# Patient Record
Sex: Female | Born: 1980 | Race: Black or African American | Hispanic: No | Marital: Single | Smoking: Never smoker
Health system: Southern US, Community
[De-identification: ages and names within clinical notes are randomized; demographics above are authoritative.]

## PROBLEM LIST (undated history)

## (undated) DIAGNOSIS — N200 Calculus of kidney: Secondary | ICD-10-CM

## (undated) HISTORY — DX: Calculus of kidney: N20.0

---

## 2015-04-06 ENCOUNTER — Emergency Department
Admission: EM | Admit: 2015-04-06 | Discharge: 2015-04-07 | Disposition: A | Discharge status: Home / Self Care | Payer: BC Managed Care – PPO | Attending: Emergency Medicine | Admitting: Emergency Medicine

## 2015-04-06 ENCOUNTER — Encounter: Payer: Self-pay | Admitting: *Deleted

## 2015-04-06 DIAGNOSIS — Z79899 Other long term (current) drug therapy: Secondary | ICD-10-CM | POA: Diagnosis not present

## 2015-04-06 DIAGNOSIS — R109 Unspecified abdominal pain: Secondary | ICD-10-CM | POA: Diagnosis present

## 2015-04-06 DIAGNOSIS — N200 Calculus of kidney: Secondary | ICD-10-CM

## 2015-04-06 DIAGNOSIS — Z3202 Encounter for pregnancy test, result negative: Secondary | ICD-10-CM | POA: Diagnosis not present

## 2015-04-06 NOTE — ED Notes (Signed)
Pt arrives ambulatory to triage with c/o left flank pain for two days, vomiting for several hours. Denies urinary symptoms

## 2015-04-07 ENCOUNTER — Emergency Department: Payer: BC Managed Care – PPO

## 2015-04-07 LAB — COMPREHENSIVE METABOLIC PANEL
ALK PHOS: 45 U/L (ref 38–126)
ALT: 12 U/L — AB (ref 14–54)
ANION GAP: 7 (ref 5–15)
AST: 18 U/L (ref 15–41)
Albumin: 4.1 g/dL (ref 3.5–5.0)
BILIRUBIN TOTAL: 0.4 mg/dL (ref 0.3–1.2)
BUN: 15 mg/dL (ref 6–20)
CALCIUM: 9.2 mg/dL (ref 8.9–10.3)
CO2: 24 mmol/L (ref 22–32)
CREATININE: 1.11 mg/dL — AB (ref 0.44–1.00)
Chloride: 105 mmol/L (ref 101–111)
GFR calc non Af Amer: >60 mL/min (ref >60)
GLUCOSE: 137 mg/dL — AB (ref 65–99)
Potassium: 3.9 mmol/L (ref 3.5–5.1)
Sodium: 136 mmol/L (ref 135–145)
TOTAL PROTEIN: 7.8 g/dL (ref 6.5–8.1)

## 2015-04-07 LAB — CBC
HCT: 33.4 % — ABNORMAL LOW (ref 35.0–47.0)
HEMOGLOBIN: 10.6 g/dL — AB (ref 12.0–16.0)
MCH: 28.4 pg (ref 26.0–34.0)
MCHC: 31.9 g/dL — AB (ref 32.0–36.0)
MCV: 89.1 fL (ref 80.0–100.0)
PLATELETS: 348 10*3/uL (ref 150–440)
RBC: 3.75 MIL/uL — AB (ref 3.80–5.20)
RDW: 16.6 % — ABNORMAL HIGH (ref 11.5–14.5)
WBC: 13.8 10*3/uL — ABNORMAL HIGH (ref 3.6–11.0)

## 2015-04-07 LAB — URINALYSIS COMPLETE WITH MICROSCOPIC (ARMC ONLY)
BILIRUBIN URINE: NEGATIVE
Glucose, UA: NEGATIVE mg/dL
Leukocytes, UA: NEGATIVE
NITRITE: NEGATIVE
PROTEIN: 30 mg/dL — AB
SPECIFIC GRAVITY, URINE: 1.029 (ref 1.005–1.030)
pH: 5 (ref 5.0–8.0)

## 2015-04-07 LAB — PREGNANCY, URINE: Preg Test, Ur: NEGATIVE

## 2015-04-07 LAB — LIPASE, BLOOD: Lipase: 26 U/L (ref 11–51)

## 2015-04-07 MED ORDER — MORPHINE SULFATE (PF) 4 MG/ML IV SOLN
4.0000 mg | Freq: Once | INTRAVENOUS | Status: AC
  Administered 2015-04-07: 4 mg via INTRAVENOUS
  Filled 2015-04-07: qty 1

## 2015-04-07 MED ORDER — SODIUM CHLORIDE 0.9 % IV BOLUS (SEPSIS)
1000.0000 mL | Freq: Once | INTRAVENOUS | Status: AC
  Administered 2015-04-07: 1000 mL via INTRAVENOUS

## 2015-04-07 MED ORDER — ONDANSETRON 4 MG PO TBDP: 4.0000 mg | ORAL_TABLET | Freq: Three times a day (TID) | ORAL | Status: AC | PRN

## 2015-04-07 MED ORDER — HYDROCODONE-ACETAMINOPHEN 5-325 MG PO TABS: 1.0000 | ORAL_TABLET | Freq: Four times a day (QID) | ORAL | Status: DC | PRN

## 2015-04-07 MED ORDER — KETOROLAC TROMETHAMINE 30 MG/ML IJ SOLN
30.0000 mg | Freq: Once | INTRAMUSCULAR | Status: AC
  Administered 2015-04-07: 30 mg via INTRAVENOUS
  Filled 2015-04-07: qty 1

## 2015-04-07 MED ORDER — ONDANSETRON HCL 4 MG/2ML IJ SOLN
4.0000 mg | Freq: Once | INTRAMUSCULAR | Status: AC
  Administered 2015-04-07: 4 mg via INTRAVENOUS
  Filled 2015-04-07: qty 2

## 2015-04-07 MED ORDER — TAMSULOSIN HCL 0.4 MG PO CAPS
0.4000 mg | ORAL_CAPSULE | Freq: Once | ORAL | Status: AC
  Administered 2015-04-07: 0.4 mg via ORAL
  Filled 2015-04-07: qty 1

## 2015-04-07 MED ORDER — TAMSULOSIN HCL 0.4 MG PO CAPS: 0.4000 mg | ORAL_CAPSULE | Freq: Every day | ORAL | Status: DC

## 2015-04-07 MED ORDER — HYDROCODONE-ACETAMINOPHEN 5-325 MG PO TABS
2.0000 | ORAL_TABLET | Freq: Once | ORAL | Status: AC
  Administered 2015-04-07: 2 via ORAL
  Filled 2015-04-07: qty 2

## 2015-04-07 NOTE — ED Notes (Signed)
Lab called to add on urine pregnancy test to urine already in lab.

## 2015-04-07 NOTE — ED Notes (Signed)
MD at bedside for reeval

## 2015-04-07 NOTE — ED Provider Notes (Signed)
[facility] Emergency Department Provider Note  ____________________________________________  Time seen: Approximately 0011 AM  I have reviewed the triage vital signs and the nursing notes.   HISTORY  Chief Complaint Flank Pain    HPI Claudia Lyons is a 34 y.o. female who comes into the hospital today with left flank pain vomiting and nausea. The patient reports that she has been vomiting for the last few hours. She reports that the symptoms started this evening at 8 PM. She started feeling sick on Saturday but the pain and the vomiting did not start until today. The patient reports that she did notice some blood in her urine but denies pain with urination. The patient does not have a history of kidney stones. She reports that the pain moves a little bit around to her abdomen. She denies any fevers or other symptoms. The patient did not take anything for pain but came in for evaluation. Nothing makes the symptoms better or worse. The patient reports that her pain is a 10 out of 10 in intensity.   History reviewed. No pertinent past medical history.  There are no active problems to display for this patient.   Past Surgical History  Procedure Laterality Date  . Cesarean section      Current Outpatient Rx  Name  Route  Sig  Dispense  Refill  . HYDROcodone-acetaminophen (NORCO) 5-325 MG tablet   Oral   Take 1-2 tablets by mouth every 6 (six) hours as needed for moderate pain.   12 tablet   0   . ondansetron (ZOFRAN ODT) 4 MG disintegrating tablet   Oral   Take 1 tablet (4 mg total) by mouth every 8 (eight) hours as needed for nausea or vomiting.   20 tablet   0   . tamsulosin (FLOMAX) 0.4 MG CAPS capsule   Oral   Take 1 capsule (0.4 mg total) by mouth daily.   7 capsule   0     Allergies Percocet  No family history on file.  Social History Social History  Substance Use Topics  . Smoking status: Never Smoker   . Smokeless tobacco: None   . Alcohol Use: No    Review of Systems Constitutional: No fever/chills Eyes: No visual changes. ENT: No sore throat. Cardiovascular: Denies chest pain. Respiratory: Denies shortness of breath. Gastrointestinal: Nausea and vomiting Genitourinary: Negative for dysuria. Musculoskeletal: Left flank pain Skin: Negative for rash. Neurological: Negative for headaches, focal weakness or numbness.  10-point ROS otherwise negative.  ____________________________________________   PHYSICAL EXAM:  VITAL SIGNS: ED Triage Vitals  Enc Vitals Group     BP 04/06/15 2352 132/85 mmHg     Pulse Rate 04/06/15 2352 66     Resp 04/06/15 2352 16     Temp 04/06/15 2352 98.4 F (36.9 C)     Temp Source 04/06/15 2352 Oral     SpO2 04/06/15 2352 100 %     Weight 04/06/15 2352 170 lb (77.111 kg)     Height 04/06/15 2352 5\' 6"  (1.676 m)     Head Cir --      Peak Flow --      Pain Score 04/06/15 2354 10     Pain Loc --      Pain Edu? --      Excl. in GC? --     Constitutional: Alert and oriented. ill appearing and in moderate to severe distress. Eyes: Conjunctivae are normal. PERRL. EOMI. Head: Atraumatic. Nose: No congestion/rhinnorhea. Mouth/Throat:  Mucous membranes are moist.  Oropharynx non-erythematous. Cardiovascular: Normal rate, regular rhythm. Grossly normal heart sounds.  Good peripheral circulation. Respiratory: Normal respiratory effort.  No retractions. Lungs CTAB. Gastrointestinal: Soft and nontender. No distention. Positive bowel sounds, left CVA tenderness to palpation Musculoskeletal: No lower extremity tenderness nor edema.   Neurologic:  Normal speech and language. No gross focal neurologic deficits are appreciated.  Skin:  Skin is warm, dry and intact.  Psychiatric: Mood and affect are normal. .  ____________________________________________   LABS (all labs ordered are listed, but only abnormal results are displayed)  Labs Reviewed  COMPREHENSIVE METABOLIC PANEL -  Abnormal; Notable for the following:    Glucose, Bld 137 (*)    Creatinine, Ser 1.11 (*)    ALT 12 (*)    All other components within normal limits  CBC - Abnormal; Notable for the following:    WBC 13.8 (*)    RBC 3.75 (*)    Hemoglobin 10.6 (*)    HCT 33.4 (*)    MCHC 31.9 (*)    RDW 16.6 (*)    All other components within normal limits  URINALYSIS COMPLETEWITH MICROSCOPIC ([facility] ONLY) - Abnormal; Notable for the following:    Color, Urine YELLOW (*)    APPearance CLOUDY (*)    Ketones, ur TRACE (*)    Hgb urine dipstick 3+ (*)    Protein, ur 30 (*)    Bacteria, UA RARE (*)    Squamous Epithelial / LPF 6-30 (*)    All other components within normal limits  LIPASE, BLOOD  PREGNANCY, URINE   ____________________________________________  EKG  None ____________________________________________  RADIOLOGY  CT renal stone study: Mild left-sided hydronephrosis with an obstructing 5 mm stone noted distally in the left vesicoureteral junction ____________________________________________   PROCEDURES  Procedure(s) performed: None  Critical Care performed: No  ____________________________________________   INITIAL IMPRESSION / ASSESSMENT AND PLAN / ED COURSE  Pertinent labs & imaging results that were available during my care of the patient were reviewed by me and considered in my medical decision making (see chart for details).  This is a 34 year old female who comes in today with some left flank pain and hematuria. I am concerned the patient may have a kidney stone and she does have some blood in her urine. I will send the patient for a CT scan to determine if she does have kidney stones causing her pain. I did give the patient a liter of normal saline, morphine 4 mg IV 1 and Zofran 4 mg IV 1. I will reassess the patient when she's receive her medication.  The patient received a second dose of morphine and was still having pain so I did give her a dose of Toradol which  did help resolve her pain. I also give the patient Flomax and oral Norco. The patient will be discharged home to follow-up with urology. ____________________________________________   FINAL CLINICAL IMPRESSION(S) / ED DIAGNOSES  Final diagnoses:  Left flank pain  Kidney stone      Rebecka Apley, MD 04/07/15 0400

## 2015-04-07 NOTE — ED Notes (Signed)
Patient transported to CT via stretcher.

## 2015-04-07 NOTE — Discharge Instructions (Signed)
Kidney Stones °Kidney stones (urolithiasis) are deposits that form inside your kidneys. The intense pain is caused by the stone moving through the urinary tract. When the stone moves, the ureter goes into spasm around the stone. The stone is usually passed in the urine.  °CAUSES  °· A disorder that makes certain neck glands produce too much parathyroid hormone (primary hyperparathyroidism). °· A buildup of uric acid crystals, similar to gout in your joints. °· Narrowing (stricture) of the ureter. °· A kidney obstruction present at birth (congenital obstruction). °· Previous surgery on the kidney or ureters. °· Numerous kidney infections. °SYMPTOMS  °· Feeling sick to your stomach (nauseous). °· Throwing up (vomiting). °· Blood in the urine (hematuria). °· Pain that usually spreads (radiates) to the groin. °· Frequency or urgency of urination. °DIAGNOSIS  °· Taking a history and physical exam. °· Blood or urine tests. °· CT scan. °· Occasionally, an examination of the inside of the urinary bladder (cystoscopy) is performed. °TREATMENT  °· Observation. °· Increasing your fluid intake. °· Extracorporeal shock wave lithotripsy--This is a noninvasive procedure that uses shock waves to break up kidney stones. °· Surgery may be needed if you have severe pain or persistent obstruction. There are various surgical procedures. Most of the procedures are performed with the use of small instruments. Only small incisions are needed to accommodate these instruments, so recovery time is minimized. °The size, location, and chemical composition are all important variables that will determine the proper choice of action for you. Talk to your health care provider to better understand your situation so that you will minimize the risk of injury to yourself and your kidney.  °HOME CARE INSTRUCTIONS  °· Drink enough water and fluids to keep your urine clear or pale yellow. This will help you to pass the stone or stone fragments. °· Strain  all urine through the provided strainer. Keep all particulate matter and stones for your health care provider to see. The stone causing the pain may be as small as a grain of salt. It is very important to use the strainer each and every time you pass your urine. The collection of your stone will allow your health care provider to analyze it and verify that a stone has actually passed. The stone analysis will often identify what you can do to reduce the incidence of recurrences. °· Only take over-the-counter or prescription medicines for pain, discomfort, or fever as directed by your health care provider. °· Keep all follow-up visits as told by your health care provider. This is important. °· Get follow-up X-rays if required. The absence of pain does not always mean that the stone has passed. It may have only stopped moving. If the urine remains completely obstructed, it can cause loss of kidney function or even complete destruction of the kidney. It is your responsibility to make sure X-rays and follow-ups are completed. Ultrasounds of the kidney can show blockages and the status of the kidney. Ultrasounds are not associated with any radiation and can be performed easily in a matter of minutes. °· Make changes to your daily diet as told by your health care provider. You may be told to: °¨ Limit the amount of salt that you eat. °¨ Eat 5 or more servings of fruits and vegetables each day. °¨ Limit the amount of meat, poultry, fish, and eggs that you eat. °· Collect a 24-hour urine sample as told by your health care provider. You may need to collect another urine sample every 6-12   months. °SEEK MEDICAL CARE IF: °· You experience pain that is progressive and unresponsive to any pain medicine you have been prescribed. °SEEK IMMEDIATE MEDICAL CARE IF:  °· Pain cannot be controlled with the prescribed medicine. °· You have a fever or shaking chills. °· The severity or intensity of pain increases over 18 hours and is not  relieved by pain medicine. °· You develop a new onset of abdominal pain. °· You feel faint or pass out. °· You are unable to urinate. °  °This information is not intended to replace advice given to you by your health care provider. Make sure you discuss any questions you have with your health care provider. °  °Document Released: 05/30/2005 Document Revised: 02/18/2015 Document Reviewed: 10/31/2012 °Elsevier Interactive Patient Education ©2016 Elsevier Inc. ° °

## 2015-04-07 NOTE — ED Notes (Signed)
MD at bedside for eval.

## 2015-04-07 NOTE — ED Notes (Signed)
Pt called out and reports that she needs more pain medication.  EDP made aware.

## 2015-04-09 ENCOUNTER — Other Ambulatory Visit: Payer: Self-pay | Admitting: *Deleted

## 2015-04-09 ENCOUNTER — Encounter: Payer: Self-pay | Admitting: Obstetrics and Gynecology

## 2015-04-09 ENCOUNTER — Ambulatory Visit (INDEPENDENT_AMBULATORY_CARE_PROVIDER_SITE_OTHER): Payer: BC Managed Care – PPO | Admitting: Obstetrics and Gynecology

## 2015-04-09 ENCOUNTER — Other Ambulatory Visit: Payer: Self-pay | Admitting: Obstetrics and Gynecology

## 2015-04-09 VITALS — BP 106/71 | HR 67 | Resp 14 | Ht 66.0 in | Wt 175.4 lb

## 2015-04-09 DIAGNOSIS — R3129 Other microscopic hematuria: Secondary | ICD-10-CM | POA: Diagnosis not present

## 2015-04-09 DIAGNOSIS — N200 Calculus of kidney: Secondary | ICD-10-CM | POA: Diagnosis not present

## 2015-04-09 LAB — URINALYSIS, COMPLETE
Renal Epithel, UA: NONE SEEN /hpf
WBC UA: NONE SEEN /HPF (ref 0–?)

## 2015-04-09 MED ORDER — HYDROCODONE-ACETAMINOPHEN 5-325 MG PO TABS: 1.0000 | ORAL_TABLET | Freq: Four times a day (QID) | ORAL | Status: AC | PRN

## 2015-04-09 MED ORDER — TAMSULOSIN HCL 0.4 MG PO CAPS
0.4000 mg | ORAL_CAPSULE | Freq: Every day | ORAL | Status: DC
Start: 2015-04-09 — End: 2015-04-09

## 2015-04-09 MED ORDER — TAMSULOSIN HCL 0.4 MG PO CAPS: 0.4000 mg | ORAL_CAPSULE | Freq: Every day | ORAL | Status: AC

## 2015-04-09 NOTE — Patient Instructions (Signed)
Dietary Guidelines to Help Prevent Kidney Stones Your risk of kidney stones can be decreased by adjusting the foods you eat. The most important thing you can do is drink enough fluid. You should drink enough fluid to keep your urine clear or pale yellow. The following guidelines provide specific information for the type of kidney stone you have had. GUIDELINES ACCORDING TO TYPE OF KIDNEY STONE Calcium Oxalate Kidney Stones  Reduce the amount of salt you eat. Foods that have a lot of salt cause your body to release excess calcium into your urine. The excess calcium can combine with a substance called oxalate to form kidney stones.  Reduce the amount of animal protein you eat if the amount you eat is excessive. Animal protein causes your body to release excess calcium into your urine. Ask your dietitian how much protein from animal sources you should be eating.  Avoid foods that are high in oxalates. If you take vitamins, they should have less than 500 mg of vitamin C. Your body turns vitamin C into oxalates. You do not need to avoid fruits and vegetables high in vitamin C. Calcium Phosphate Kidney Stones  Reduce the amount of salt you eat to help prevent the release of excess calcium into your urine.  Reduce the amount of animal protein you eat if the amount you eat is excessive. Animal protein causes your body to release excess calcium into your urine. Ask your dietitian how much protein from animal sources you should be eating.  Get enough calcium from food or take a calcium supplement (ask your dietitian for recommendations). Food sources of calcium that do not increase your risk of kidney stones include:  Broccoli.  Dairy products, such as cheese and yogurt.  Pudding. Uric Acid Kidney Stones  Do not have more than 6 oz of animal protein per day. FOOD SOURCES Animal Protein Sources  Meat (all types).  Poultry.  Eggs.  Fish, seafood. Foods High in Salt  Salt seasonings.  Soy  sauce.  Teriyaki sauce.  Cured and processed meats.  Salted crackers and snack foods.  Fast food.  Canned soups and most canned foods. Foods High in Oxalates  Grains:  Amaranth.  Barley.  Grits.  Wheat germ.  Bran.  Buckwheat flour.  All bran cereals.  Pretzels.  Whole wheat bread.  Vegetables:  Beans (wax).  Beets and beet greens.  Collard greens.  Eggplant.  Escarole.  Leeks.  Okra.  Parsley.  Rutabagas.  Spinach.  Swiss chard.  Tomato paste.  Fried potatoes.  Sweet potatoes.  Fruits:  Red currants.  Figs.  Kiwi.  Rhubarb.  Meat and Other Protein Sources:  Beans (dried).  Soy burgers and other soybean products.  Miso.  Nuts (peanuts, almonds, pecans, cashews, hazelnuts).  Nut butters.  Sesame seeds and tahini (paste made of sesame seeds).  Poppy seeds.  Beverages:  Chocolate drink mixes.  Soy milk.  Instant iced tea.  Juices made from high-oxalate fruits or vegetables.  Other:  Carob.  Chocolate.  Fruitcake.  Marmalades.   This information is not intended to replace advice given to you by your health care provider. Make sure you discuss any questions you have with your health care provider.   Document Released: 09/24/2010 Document Revised: 06/04/2013 Document Reviewed: 04/26/2013 Elsevier Interactive Patient Education 2016 Elsevier Inc.  

## 2015-04-09 NOTE — Progress Notes (Signed)
04/09/2015 10:27 AM   Claudia Lyons [birth year] [id number]  Referring provider: No referring provider defined for this encounter.  Chief Complaint  Patient presents with  . Establish Care  . Nephrolithiasis    HPI: Patient is a 34yo female presenting today for follow up after being seen in the ED for acute onset of left flank pain. CT remarkable for mild left-sided hydronephrosis, with an obstructing 5 mm stone noted distally at the left vesicoureteral junction. She reports continued intermittent pain. She took her last Norco at 6 AM this morning and appears comfortable today in the office. She denies any fevers or gross hematuria. No other urinary symptoms.     No previous history of stones.  Baseline H2O intake 3-4 glasses per day.  04/06/15 Serum WBC 13.8 Cr 1.11 UA TNTC RBCs, 6-30 WBCs, rare bacteria  PMH: Past Medical History  Diagnosis Date  . Kidney stone     Surgical History: Past Surgical History  Procedure Laterality Date  . Cesarean section      Home Medications:    Medication List       This list is accurate as of: 04/09/15 10:27 AM.  Always use your most recent med list.               HYDROcodone-acetaminophen 5-325 MG tablet  Commonly known as:  NORCO  Take 1-2 tablets by mouth every 6 (six) hours as needed for moderate pain.     ondansetron 4 MG disintegrating tablet  Commonly known as:  ZOFRAN ODT  Take 1 tablet (4 mg total) by mouth every 8 (eight) hours as needed for nausea or vomiting.     tamsulosin 0.4 MG Caps capsule  Commonly known as:  FLOMAX  Take 1 capsule (0.4 mg total) by mouth daily.        Allergies:  Allergies  Allergen Reactions  . Percocet [Oxycodone-Acetaminophen] Nausea Only    Family History: Family History  Problem Relation Age of Onset  . Heart disease Father   . Diabetes Father   . Hypertension Mother     Social History:  reports that she has never smoked. She does not have any smokeless tobacco  history on file. She reports that she does not drink alcohol or use illicit drugs.  ROS: UROLOGY Frequent Urination?: No Hard to postpone urination?: No Burning/pain with urination?: No Get up at night to urinate?: No Leakage of urine?: No Urine stream starts and stops?: No Trouble starting stream?: No Do you have to strain to urinate?: No Blood in urine?: No Urinary tract infection?: No Sexually transmitted disease?: No Injury to kidneys or bladder?: No Painful intercourse?: No Weak stream?: No Currently pregnant?: No Vaginal bleeding?: No Last menstrual period?: n/a  Gastrointestinal Nausea?: No Vomiting?: No Indigestion/heartburn?: No Diarrhea?: No Constipation?: No  Constitutional Fever: No Night sweats?: No Weight loss?: No Fatigue?: Yes  Skin Skin rash/lesions?: No Itching?: No  Eyes Blurred vision?: No Double vision?: No  Ears/Nose/Throat Sore throat?: No Sinus problems?: No  Hematologic/Lymphatic Swollen glands?: No Easy bruising?: No  Cardiovascular Leg swelling?: No Chest pain?: No  Respiratory Cough?: No Shortness of breath?: No  Endocrine Excessive thirst?: No  Musculoskeletal Back pain?: Yes Joint pain?: No  Neurological Headaches?: No Dizziness?: No  Psychologic Depression?: No Anxiety?: No  Physical Exam: BP 106/71 mmHg  Pulse 67  Resp 14  Ht  (1.676 m)  Wt 175 lb 6.4 oz (79.561 kg)  BMI 28.32 kg/m2  LMP 03/16/2015  Constitutional:  Alert  and oriented, No acute distress. HEENT: [place] AT, moist mucus membranes.  Trachea midline, no masses. Cardiovascular: No clubbing, cyanosis, or edema. Respiratory: Normal respiratory effort, no increased work of breathing. GI: Abdomen is soft, nontender, nondistended, no abdominal masses GU: Mild left CVA tenderness. Skin: No rashes, bruises or suspicious lesions. Lymph: No cervical or inguinal adenopathy. Neurologic: Grossly intact, no focal deficits, moving all 4  extremities. Psychiatric: Normal mood and affect.  Laboratory Data:   Urinalysis    Component Value Date/Time   COLORURINE YELLOW* 04/06/2015 2357   APPEARANCEUR CLOUDY* 04/06/2015 2357   LABSPEC 1.029 04/06/2015 2357   PHURINE 5.0 04/06/2015 2357   GLUCOSEU NEGATIVE 04/06/2015 2357   HGBUR 3+* 04/06/2015 2357   BILIRUBINUR NEGATIVE 04/06/2015 2357   KETONESUR TRACE* 04/06/2015 2357   PROTEINUR 30* 04/06/2015 2357   NITRITE NEGATIVE 04/06/2015 2357   LEUKOCYTESUR NEGATIVE 04/06/2015 2357    Pertinent Imaging:  CLINICAL DATA: Acute onset of left flank pain for 2 days and vomiting. Initial encounter.  EXAM: CT ABDOMEN AND PELVIS WITHOUT CONTRAST  TECHNIQUE: Multidetector CT imaging of the abdomen and pelvis was performed following the standard protocol without IV contrast.  COMPARISON: None.  FINDINGS: The visualized lung bases are clear.  The liver and spleen are unremarkable in appearance. The gallbladder is within normal limits. The pancreas and adrenal glands are unremarkable.  Mild left-sided hydronephrosis is noted, with left-sided perinephric stranding and fluid, and prominence of the left ureter along its entire course. An obstructing 5 mm stone is noted distally at the left vesicoureteral junction. The right kidney is unremarkable in appearance. No nonobstructing renal stones are identified.  No free fluid is identified. The small bowel is unremarkable in appearance. The stomach is within normal limits. No acute vascular abnormalities are seen.  The appendix is normal in caliber, without evidence of appendicitis. The colon is unremarkable in appearance.  The bladder is mildly distended and grossly unremarkable. The uterus is grossly unremarkable. The ovaries are relatively symmetric. No suspicious adnexal masses are seen. No inguinal lymphadenopathy is seen.  No acute osseous abnormalities are identified.  IMPRESSION: Mild left-sided  hydronephrosis, with an obstructing 5 mm stone noted distally at the left vesicoureteral junction.   Electronically Signed  By: Roanna RaiderJeffery Chang M.D.  On: 04/07/2015 02:29     Assessment & Plan:    1. Nephrolithiasis- Mild left-sided hydronephrosis, with an obstructing 5 mm stone noted distally at the left vesicoureteral junction. We discussed general stone prevention techniques including drinking plenty water with goal of producing 2.5 L urine daily, increased citric acid intake, avoidance of high oxalate containing foods, and decreased salt intake.  Information about dietary recommendations given today.  - Urinalysis, Complete  2. Microscopic Hematuria-  Will recheck once stone episode resolved.  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  These notes generated with voice recognition software. I apologize for typographical errors.  Earlie LouLindsay Avari Nevares, FNP  [facility] [address], [address] [place], [place] [address] [phone]

## 2015-04-09 NOTE — Addendum Note (Signed)
Addended by: Fernanda DrumVERTON, Jensen Cheramie C on: 04/09/2015 04:54 PM   Modules accepted: Orders

## 2015-04-11 LAB — CULTURE, URINE COMPREHENSIVE

## 2015-04-14 ENCOUNTER — Telehealth: Payer: Self-pay | Admitting: Obstetrics and Gynecology

## 2015-04-14 ENCOUNTER — Telehealth: Payer: Self-pay

## 2015-04-14 NOTE — Telephone Encounter (Signed)
Pt called stating that she has not taken any pain medicine since Friday, but is still taking the Flomax.  Passed a stone on Monday 10/31.  Not having anymore pain.  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  Please call to advise [phone].

## 2015-04-14 NOTE — Telephone Encounter (Signed)
-----   Message from Fernanda DrumLindsay C Overton, FNP sent at 04/13/2015 11:53 AM EDT ----- Please notify patient that her urine culture was negative for infection.

## 2015-04-14 NOTE — Telephone Encounter (Signed)
No vm

## 2015-04-15 NOTE — Telephone Encounter (Signed)
Yes if she has passed the stone and pain has resolved she should be ok to drive.  If she needs a note to go back to work we can give her one.  She still needs to get her f/u [facility] and come to her f/u appt.  thanks

## 2015-04-15 NOTE — Telephone Encounter (Signed)
No vm

## 2015-04-15 NOTE — Telephone Encounter (Signed)
VM not set up.

## 2015-04-15 NOTE — Telephone Encounter (Signed)
Spoke with pt in reference ucx and note to return to work. Pt requested note for work be faxed to HR and voiced understanding of ucx. Note was written, signed and faxed for pt.

## 2015-04-24 ENCOUNTER — Ambulatory Visit: Payer: BC Managed Care – PPO | Admitting: Obstetrics and Gynecology

## 2015-06-01 ENCOUNTER — Ambulatory Visit
Admission: RE | Admit: 2015-06-01 | Discharge: 2015-06-01 | Disposition: A | Discharge status: Home / Self Care | Payer: BC Managed Care – PPO | Source: Ambulatory Visit | Attending: Obstetrics and Gynecology | Admitting: Obstetrics and Gynecology

## 2015-06-01 DIAGNOSIS — N200 Calculus of kidney: Secondary | ICD-10-CM | POA: Insufficient documentation

## 2016-07-18 NOTE — Progress Notes (Signed)
07/19/2016 11:42 AM   Claudia Lyons [birth year] [id number]  Referring provider: No referring provider defined for this encounter.  Chief Complaint  Patient presents with  . Follow-up    pain due to kidney stone  patient last seen 10/16    HPI: Patient is a 36 year old African American female who presents today for the complaint of left flank pain.  Patient states that two weeks ago she had a sudden onset of left sided flank pain.  No radiate.  Pain was 7/10.  Movement made it worse.  Nothing made it better.  Pain described as a "soreness."  No hematuria, fevers, chills, nausea or vomiting.    She does not have pain today.    Patient has a history of nephrolithiasis.  CT last year was remarkable for mild left-sided hydronephrosis, with an obstructing 5 mm stone noted distally at the left vesicoureteral junction which she spontaneously passed.  RUS confirmed resolution of the hydronephrosis.  Stone composition is unknown.                                    Baseline H2O intake 3-4 glasses per day.   PMH: Past Medical History:  Diagnosis Date  . Kidney stone     Surgical History: Past Surgical History:  Procedure Laterality Date  . CESAREAN SECTION      Home Medications:  Allergies as of 07/19/2016      Reactions   Percocet [oxycodone-acetaminophen] Nausea Only      Medication List       Accurate as of 07/19/16 11:42 AM. Always use your most recent med list.          HYDROcodone-acetaminophen 5-325 MG tablet Commonly known as:  NORCO Take 1-2 tablets by mouth every 6 (six) hours as needed for moderate pain.   ondansetron 4 MG disintegrating tablet Commonly known as:  ZOFRAN ODT Take 1 tablet (4 mg total) by mouth every 8 (eight) hours as needed for nausea or vomiting.       Allergies:  Allergies  Allergen Reactions  . Percocet [Oxycodone-Acetaminophen] Nausea Only    Family History: Family History  Problem Relation Age of Onset  . Heart disease Father    . Diabetes Father   . Hypertension Mother   . Kidney cancer Neg Hx   . Prostate cancer Neg Hx   . Bladder Cancer Neg Hx     Social History:  reports that she has never smoked. She has never used smokeless tobacco. She reports that she does not drink alcohol or use drugs.  ROS: UROLOGY Frequent Urination?: No Hard to postpone urination?: No Burning/pain with urination?: No Get up at night to urinate?: No Leakage of urine?: No Urine stream starts and stops?: No Trouble starting stream?: No Do you have to strain to urinate?: No Blood in urine?: No Urinary tract infection?: No Sexually transmitted disease?: No Injury to kidneys or bladder?: No Painful intercourse?: No Weak stream?: No Currently pregnant?: No Vaginal bleeding?: No Last menstrual period?: n  Gastrointestinal Nausea?: No Vomiting?: No Indigestion/heartburn?: No Diarrhea?: No Constipation?: No  Constitutional Fever: No Night sweats?: No Weight loss?: No Fatigue?: Yes  Skin Skin rash/lesions?: No Itching?: No  Eyes Blurred vision?: No Double vision?: No  Ears/Nose/Throat Sore throat?: No Sinus problems?: No  Hematologic/Lymphatic Swollen glands?: No Easy bruising?: No  Cardiovascular Leg swelling?: No Chest pain?: No  Respiratory Cough?: No Shortness of breath?: No  Endocrine Excessive thirst?: No  Musculoskeletal Back pain?: Yes Joint pain?: No  Neurological Headaches?: No Dizziness?: No  Psychologic Depression?: No Anxiety?: No  Physical Exam: BP 126/81   Pulse 73   Ht 5\' 6"  (1.676 m)   Wt 186 lb 4.8 oz (84.5 kg)   LMP 07/07/2015   BMI 30.07 kg/m   Constitutional:  Alert and oriented, No acute distress. HEENT: [place] AT, moist mucus membranes.  Trachea midline, no masses. Cardiovascular: No clubbing, cyanosis, or edema. Respiratory: Normal respiratory effort, no increased work of breathing. GI: Abdomen is soft, nontender, nondistended, no abdominal masses GU: Mild  left CVA tenderness. Skin: No rashes, bruises or suspicious lesions. Lymph: No cervical or inguinal adenopathy. Neurologic: Grossly intact, no focal deficits, moving all 4 extremities. Psychiatric: Normal mood and affect.  Laboratory Data: Urinalysis Unremarkable.  See EPIC.     Assessment & Plan:    1. Left flank pain  - history of stones; obtain a CT Renal stone study  - Urinalysis, Complete  - Advised to contact our office or seek treatment in the ED if becomes febrile or pain/ vomiting are difficult control in order to arrange for emergent/urgent intervention  2. History of nephrolithiasis  - spontaneously passed left ureteral stone one year ago  - reviewed general stone prevention techniques including drinking plenty water with goal of producing 2.5 L urine daily, increased citric acid intake, avoidance of high oxalate containing foods, and decreased salt intake.  Information about dietary recommendations given today.     Return for CT scan report.  These notes generated with voice recognition software. I apologize for typographical errors.  Michiel Cowboy, PA-C  [facility] [address] [place], [place] [address] [phone]

## 2016-07-19 ENCOUNTER — Encounter: Payer: Self-pay | Admitting: Urology

## 2016-07-19 ENCOUNTER — Ambulatory Visit: Payer: BC Managed Care – PPO | Admitting: Urology

## 2016-07-19 VITALS — BP 126/81 | HR 73 | Ht 66.0 in | Wt 186.3 lb

## 2016-07-19 DIAGNOSIS — Z87442 Personal history of urinary calculi: Secondary | ICD-10-CM | POA: Diagnosis not present

## 2016-07-19 DIAGNOSIS — R109 Unspecified abdominal pain: Secondary | ICD-10-CM | POA: Diagnosis not present

## 2016-07-19 DIAGNOSIS — R10A0 Flank pain, unspecified side: Secondary | ICD-10-CM

## 2016-07-19 LAB — MICROSCOPIC EXAMINATION
BACTERIA UA: NONE SEEN
RBC, UA: NONE SEEN /hpf (ref 0–?)

## 2016-07-19 LAB — URINALYSIS, COMPLETE
Bilirubin, UA: NEGATIVE
Glucose, UA: NEGATIVE
KETONES UA: NEGATIVE
LEUKOCYTES UA: NEGATIVE
Nitrite, UA: NEGATIVE
RBC, UA: NEGATIVE
SPEC GRAV UA: 1.02 (ref 1.005–1.030)
Urobilinogen, Ur: 0.2 mg/dL (ref 0.2–1.0)
pH, UA: 7.5 (ref 5.0–7.5)

## 2016-07-20 LAB — HCG, SERUM, QUALITATIVE: hCG,Beta Subunit,Qual,Serum: NEGATIVE m[IU]/mL (ref <6)

## 2016-07-21 LAB — CULTURE, URINE COMPREHENSIVE

## 2016-08-12 ENCOUNTER — Ambulatory Visit
Admission: RE | Admit: 2016-08-12 | Discharge: 2016-08-12 | Disposition: A | Discharge status: Home / Self Care | Payer: BC Managed Care – PPO | Source: Ambulatory Visit | Attending: Urology | Admitting: Urology

## 2016-08-12 DIAGNOSIS — R109 Unspecified abdominal pain: Secondary | ICD-10-CM | POA: Insufficient documentation

## 2017-03-03 IMAGING — CT CT RENAL STONE PROTOCOL
3 of 4 series · 9 of 46 positions shown, 16 images · non-contrast
Comparison: None.

CLINICAL DATA: Acute onset of left flank pain for 2 days and
vomiting. Initial encounter.

EXAM:
CT ABDOMEN AND PELVIS WITHOUT CONTRAST
TECHNIQUE: Multidetector CT imaging of the abdomen and pelvis was performed
following the standard protocol without IV contrast.

[Series 4: lung · axial · 0.68mm/px · z∈[-68,+12]mm · 5 of 26 slices shown, 10 images]
[im 5/26  soft-tissue]
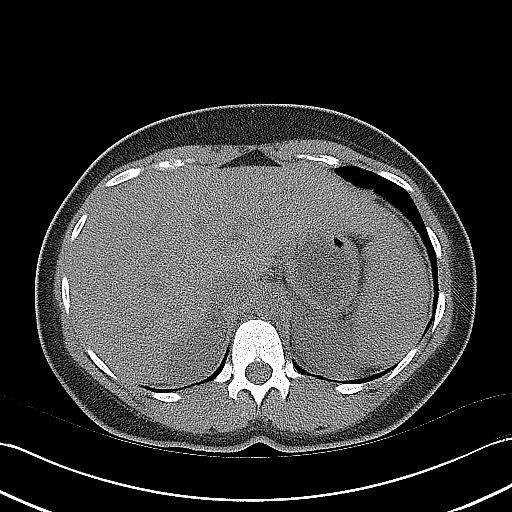
[im 5/26  bone]
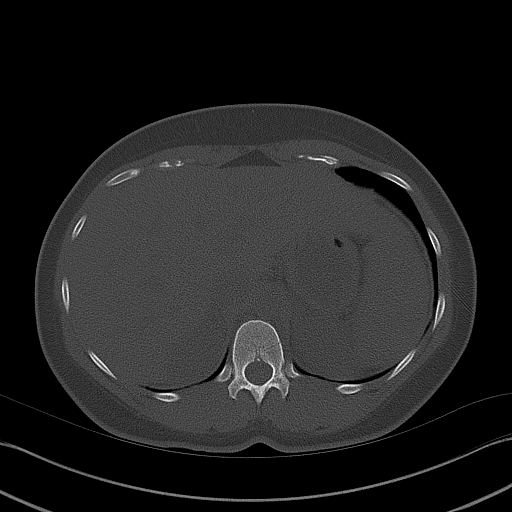
[im 9/26  soft-tissue]
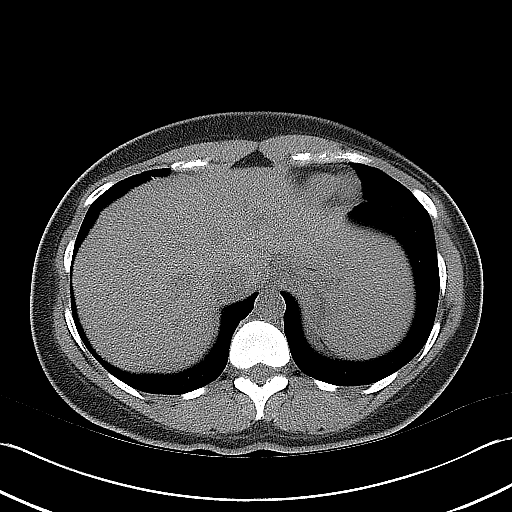
[im 9/26  lung]
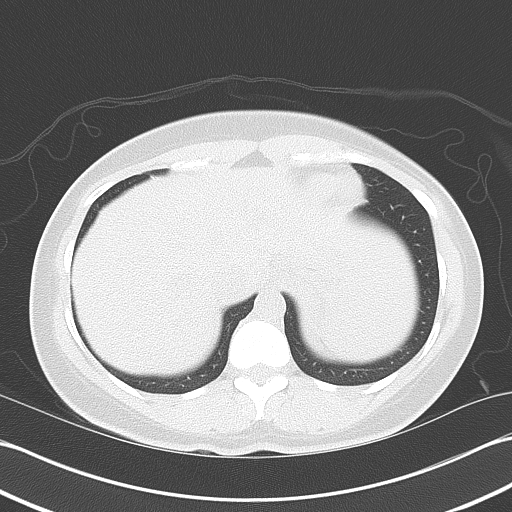
[im 13/26  soft-tissue]
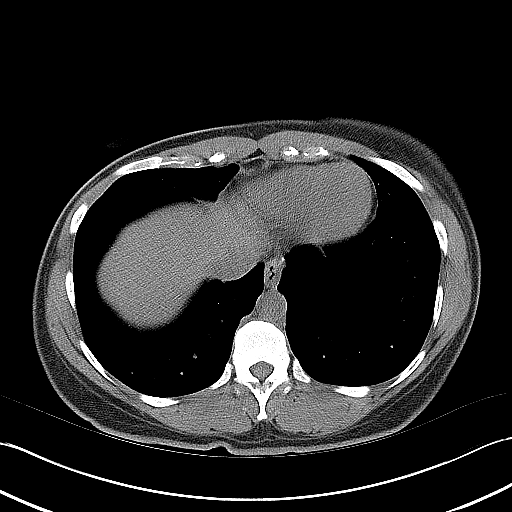
[im 13/26  lung]
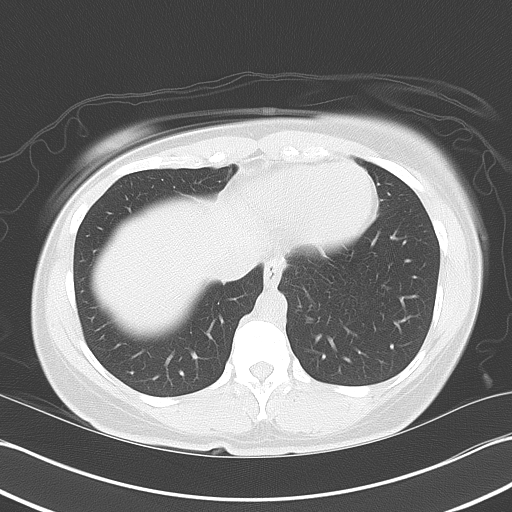
[im 17/26  soft-tissue]
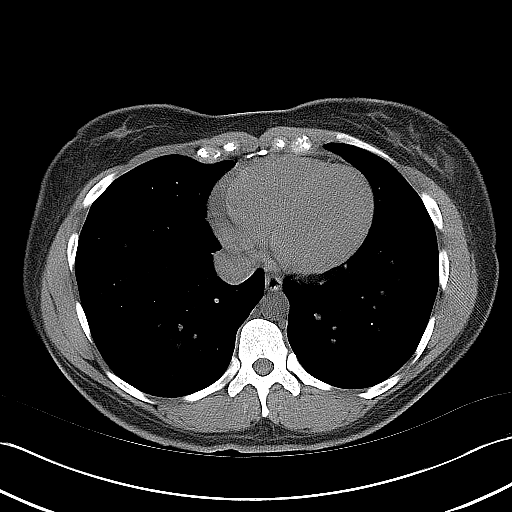
[im 17/26  lung]
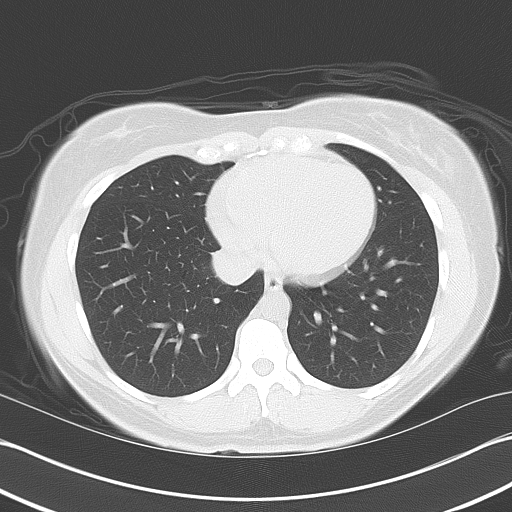
[im 21/26  soft-tissue]
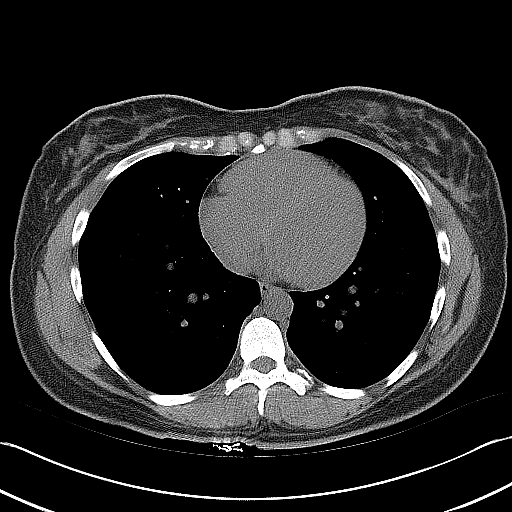
[im 21/26  lung]
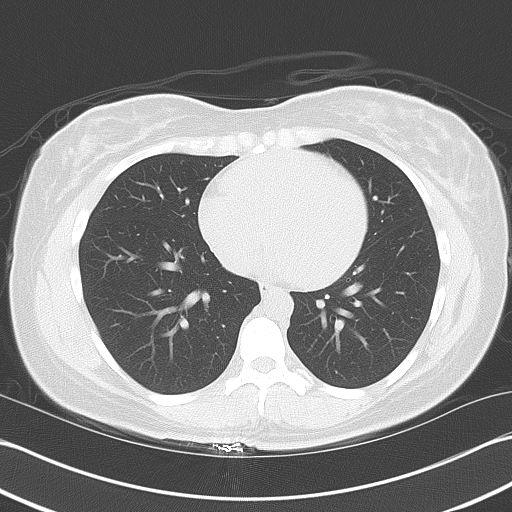

[Series 5: coronal · coronal · 0.69mm/px · 3 of 117 slices shown, 4 images]
[im 39/117  soft-tissue]
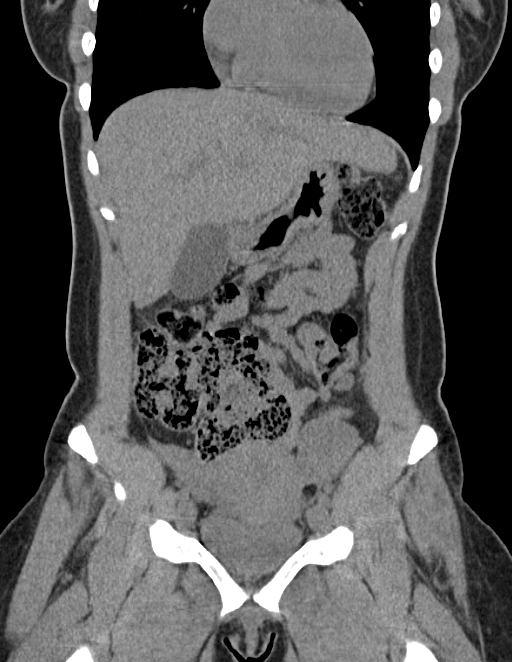
[im 52/117  soft-tissue]
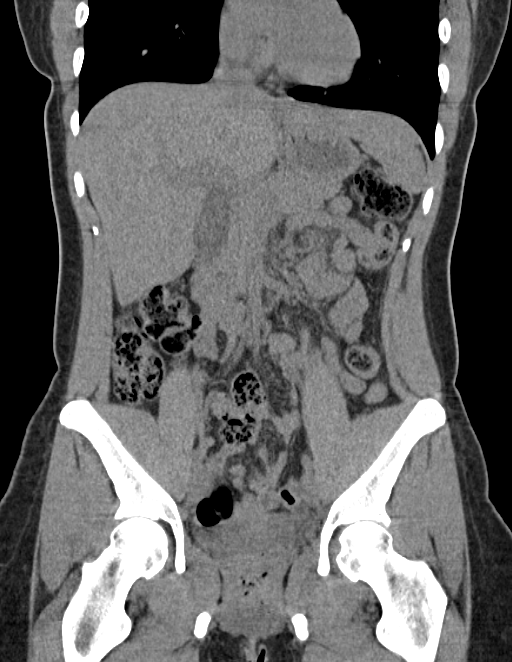
[im 52/117  bone]
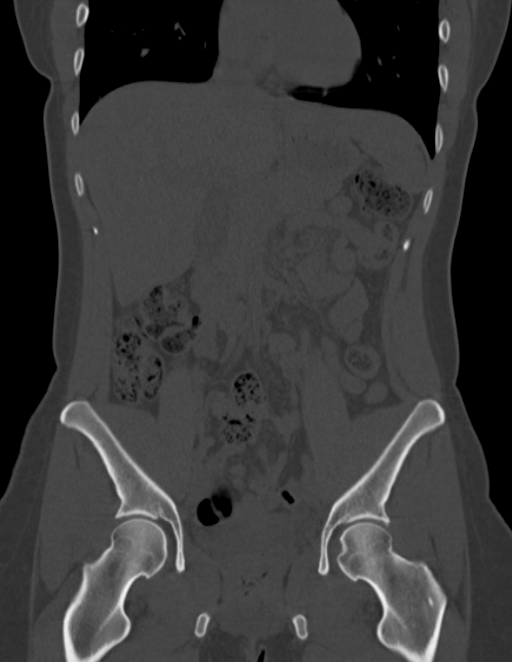
[im 65/117  soft-tissue]
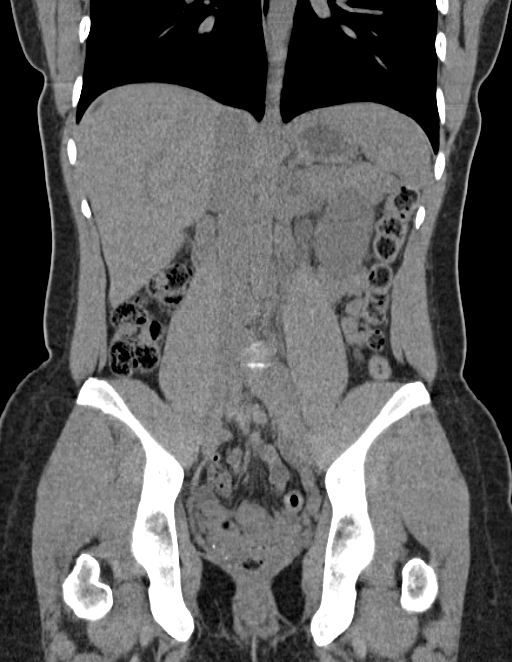

[Series 6: sagittal · sagittal · 0.58mm/px · 1 of 158 slices shown, 2 images]
[im 53/158  soft-tissue]
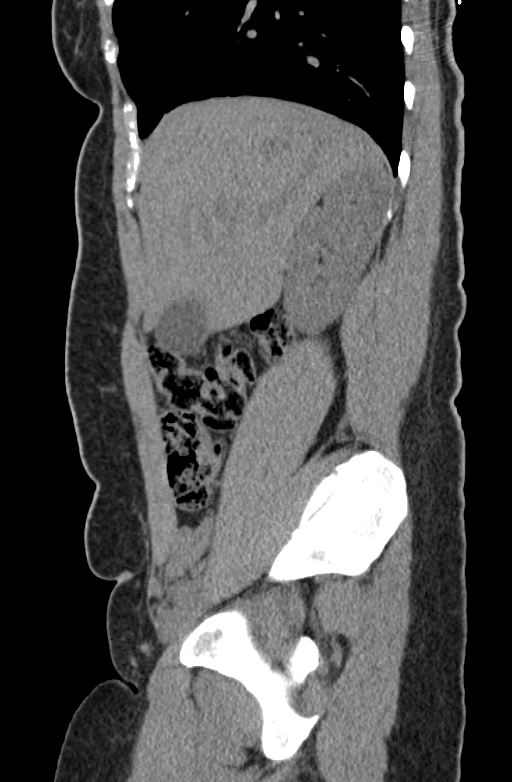
[im 53/158  bone]
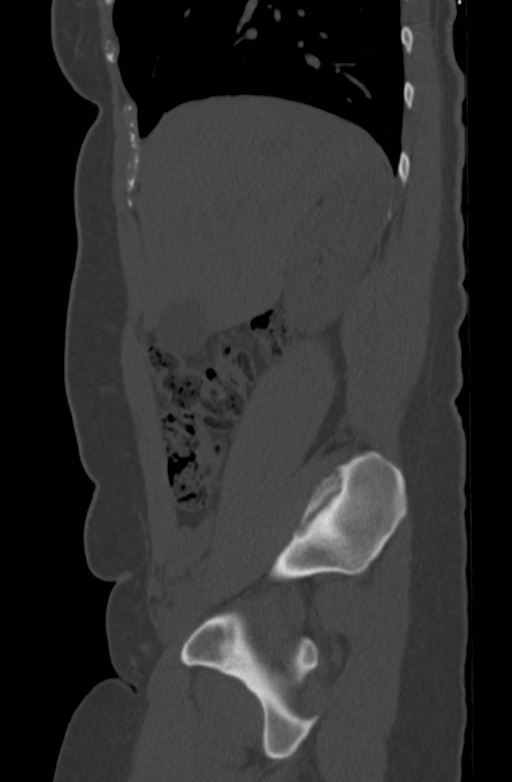

[9 of 46 positions shown; findings below may reference images not displayed]

FINDINGS: The visualized lung bases are clear.

The liver and spleen are unremarkable in appearance. The gallbladder
is within normal limits. The pancreas and adrenal glands are
unremarkable.

Mild left-sided hydronephrosis is noted, with left-sided perinephric
stranding and fluid, and prominence of the left ureter along its
entire course. An obstructing 5 mm stone is noted distally at the
left vesicoureteral junction. The right kidney is unremarkable in
appearance. No nonobstructing renal stones are identified.

No free fluid is identified. The small bowel is unremarkable in
appearance. The stomach is within normal limits. No acute vascular
abnormalities are seen.

The appendix is normal in caliber, without evidence of appendicitis.
The colon is unremarkable in appearance.

The bladder is mildly distended and grossly unremarkable. The uterus
is grossly unremarkable. The ovaries are relatively symmetric. No
suspicious adnexal masses are seen. No inguinal lymphadenopathy is
seen.

No acute osseous abnormalities are identified.
IMPRESSION: Mild left-sided hydronephrosis, with an obstructing 5 mm stone noted
distally at the left vesicoureteral junction.
